# Patient Record
Sex: Female | Born: 1965 | Race: White | Hispanic: No
Health system: Southern US, Community
[De-identification: ages and names within clinical notes are randomized; demographics above are authoritative.]

## PROBLEM LIST (undated history)

## (undated) DIAGNOSIS — F32A Depression, unspecified: Secondary | ICD-10-CM

## (undated) DIAGNOSIS — F329 Major depressive disorder, single episode, unspecified: Secondary | ICD-10-CM

---
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
## 2016-03-10 ENCOUNTER — Other Ambulatory Visit: Payer: Self-pay | Admitting: Family Medicine

## 2016-03-10 DIAGNOSIS — Z1231 Encounter for screening mammogram for malignant neoplasm of breast: Secondary | ICD-10-CM

## 2016-03-20 ENCOUNTER — Ambulatory Visit
Admission: RE | Admit: 2016-03-20 | Discharge: 2016-03-20 | Disposition: A | Discharge status: Home / Self Care | Payer: PRIVATE HEALTH INSURANCE | Source: Ambulatory Visit | Attending: Family Medicine | Admitting: Family Medicine

## 2016-03-20 DIAGNOSIS — Z1231 Encounter for screening mammogram for malignant neoplasm of breast: Secondary | ICD-10-CM

## 2019-01-28 ENCOUNTER — Other Ambulatory Visit: Payer: PRIVATE HEALTH INSURANCE

## 2019-01-28 ENCOUNTER — Other Ambulatory Visit: Payer: Self-pay

## 2019-01-28 DIAGNOSIS — Z20822 Contact with and (suspected) exposure to covid-19: Secondary | ICD-10-CM

## 2019-01-30 LAB — NOVEL CORONAVIRUS, NAA: SARS-CoV-2, NAA: NOT DETECTED

## 2019-02-01 ENCOUNTER — Telehealth: Payer: Self-pay | Admitting: Emergency Medicine

## 2019-02-01 NOTE — Telephone Encounter (Signed)
Patient returned [name]'s call about COVID results, verified identity with two identifiers and then provided negative result via phone.  Patient verbalized understanding

## 2019-03-27 ENCOUNTER — Other Ambulatory Visit: Payer: Self-pay

## 2019-03-27 DIAGNOSIS — Z20822 Contact with and (suspected) exposure to covid-19: Secondary | ICD-10-CM

## 2019-03-28 LAB — NOVEL CORONAVIRUS, NAA: SARS-CoV-2, NAA: NOT DETECTED

## 2019-07-16 ENCOUNTER — Other Ambulatory Visit: Payer: Self-pay | Admitting: Physician Assistant

## 2019-07-16 DIAGNOSIS — Z1231 Encounter for screening mammogram for malignant neoplasm of breast: Secondary | ICD-10-CM

## 2019-08-25 ENCOUNTER — Ambulatory Visit
Admission: RE | Admit: 2019-08-25 | Discharge: 2019-08-25 | Disposition: A | Discharge status: Home / Self Care | Payer: PRIVATE HEALTH INSURANCE | Source: Ambulatory Visit | Attending: Physician Assistant | Admitting: Physician Assistant

## 2019-08-25 ENCOUNTER — Other Ambulatory Visit: Payer: Self-pay

## 2019-08-25 DIAGNOSIS — Z1231 Encounter for screening mammogram for malignant neoplasm of breast: Secondary | ICD-10-CM

## 2019-12-01 ENCOUNTER — Encounter: Payer: Self-pay | Admitting: Emergency Medicine

## 2019-12-01 ENCOUNTER — Ambulatory Visit
Admission: EM | Admit: 2019-12-01 | Discharge: 2019-12-01 | Disposition: A | Discharge status: Home / Self Care | Payer: PRIVATE HEALTH INSURANCE

## 2019-12-01 ENCOUNTER — Other Ambulatory Visit: Payer: Self-pay

## 2019-12-01 DIAGNOSIS — R059 Cough, unspecified: Secondary | ICD-10-CM

## 2019-12-01 DIAGNOSIS — J0100 Acute maxillary sinusitis, unspecified: Secondary | ICD-10-CM | POA: Diagnosis not present

## 2019-12-01 HISTORY — DX: Depression, unspecified: F32.A

## 2019-12-01 MED ORDER — BENZONATATE 100 MG PO CAPS
100.0000 mg | ORAL_CAPSULE | Freq: Three times a day (TID) | ORAL | 0 refills | Status: AC
Start: 2019-12-01 — End: ?

## 2019-12-01 MED ORDER — AMOXICILLIN-POT CLAVULANATE 875-125 MG PO TABS: 1.0000 | ORAL_TABLET | Freq: Two times a day (BID) | ORAL | 0 refills | Status: AC

## 2019-12-01 NOTE — ED Triage Notes (Signed)
Sinus congestion, hacking cough and ears feels stopped up  x 12 days.  Has taken nyquil, delsym and claritin d with no relief.

## 2019-12-01 NOTE — Discharge Instructions (Signed)
Get plenty of rest and push fluids Tessalon Perles prescribed for cough Augmentin prescribed.  Take as directed and to completion Use OTC medications like ibuprofen or tylenol as needed fever or pain Call or go to the ED if you have any new or worsening symptoms such as fever, worsening cough, shortness of breath, chest tightness, chest pain, turning blue, changes in mental status, etc..Marland Kitchen

## 2019-12-01 NOTE — ED Provider Notes (Signed)
[facility]   [id number] 12/01/19 Arrival Time: 1532   CC: URI symptoms  SUBJECTIVE: History from: patient.  Ashley Vargas is a 54 y.o. female who presents with sinus congestion, sinus pain/ pressure, hacking cough and ear pressure x 12 days.  Denies sick exposure to COVID, flu or strep.  Has tried nyquil, delsym, and claritin d with relief.  Denies aggravating factors.  Reports previous symptoms in the past with sinus infection.   Denies fever, chills, SOB, wheezing, chest pain, nausea, changes in bowel or bladder habits.    ROS: As per HPI.  All other pertinent ROS negative.     Past Medical History:  Diagnosis Date  . Depression    History reviewed. No pertinent surgical history. Allergies  Allergen Reactions  . Shellfish Allergy Anaphylaxis   No current facility-administered medications on file prior to encounter.   Current Outpatient Medications on File Prior to Encounter  Medication Sig Dispense Refill  . FLUoxetine (PROZAC) 10 MG capsule Take 10 mg by mouth daily.     Social History   Socioeconomic History  . Marital status: Unknown    Spouse name: Not on file  . Number of children: Not on file  . Years of education: Not on file  . Highest education level: Not on file  Occupational History  . Not on file  Tobacco Use  . Smoking status: Never Smoker  . Smokeless tobacco: Never Used  Substance and Sexual Activity  . Alcohol use: Yes    Comment: 3- 5 days a week   . Drug use: Never  . Sexual activity: Not on file  Other Topics Concern  . Not on file  Social History Narrative  . Not on file   Social Determinants of Health   Financial Resource Strain:   . Difficulty of Paying Living Expenses:   Food Insecurity:   . Worried About Programme researcher, broadcasting/film/video in the Last Year:   . Barista in the Last Year:   Transportation Needs:   . Freight forwarder (Medical):   Marland Kitchen Lack of Transportation (Non-Medical):   Physical Activity:   .  Days of Exercise per Week:   . Minutes of Exercise per Session:   Stress:   . Feeling of Stress :   Social Connections:   . Frequency of Communication with Friends and Family:   . Frequency of Social Gatherings with Friends and Family:   . Attends Religious Services:   . Active Member of Clubs or Organizations:   . Attends Banker Meetings:   Marland Kitchen Marital Status:   Intimate Partner Violence:   . Fear of Current or Ex-Partner:   . Emotionally Abused:   Marland Kitchen Physically Abused:   . Sexually Abused:    No family history on file.  OBJECTIVE:  Vitals:   12/01/19 1544 12/01/19 1548  BP:  132/89  Pulse:  89  Resp:  18  Temp:  98.7 F (37.1 C)  TempSrc:  Oral  SpO2:  95%  Weight: 189 lb (85.7 kg)   Height: 5\' 7"  (1.702 m)     General appearance: alert; appears fatigued, but nontoxic; speaking in full sentences and tolerating own secretions HEENT: NCAT; Ears: EACs clear, TMs pearly gray; Eyes: PERRL.  EOM grossly intact. Sinuses: TTP over frontal and maxillary sinuses; Nose: nares patent without rhinorrhea, nares erythematous; Throat: oropharynx clear, tonsils non erythematous or enlarged, uvula midline  Neck: supple without LAD Lungs: unlabored respirations, symmetrical air entry;  cough: moderate; no respiratory distress; CTAB Heart: regular rate and rhythm.   Skin: warm and dry Psychological: alert and cooperative; normal mood and affect  ASSESSMENT & PLAN:  1. Acute non-recurrent maxillary sinusitis   2. Cough     Meds ordered this encounter  Medications  . amoxicillin-clavulanate (AUGMENTIN) 875-125 MG tablet    Sig: Take 1 tablet by mouth every 12 (twelve) hours for 10 days.    Dispense:  20 tablet    Refill:  0    Order Specific Question:   Supervising Provider    Answer:   Raylene Everts [[id number]]  . benzonatate (TESSALON) 100 MG capsule    Sig: Take 1 capsule (100 mg total) by mouth every 8 (eight) hours.    Dispense:  21 capsule    Refill:  0     Order Specific Question:   Supervising Provider    Answer:   Raylene Everts [[id number]]   Get plenty of rest and push fluids Tessalon Perles prescribed for cough Augmentin prescribed.  Take as directed and to completion Use OTC medications like ibuprofen or tylenol as needed fever or pain Call or go to the ED if you have any new or worsening symptoms such as fever, worsening cough, shortness of breath, chest tightness, chest pain, turning blue, changes in mental status, etc...   Reviewed expectations re: course of current medical issues. Questions answered. Outlined signs and symptoms indicating need for more acute intervention. Patient verbalized understanding. After Visit Summary given.         Lestine Box, PA-C 12/01/19 1607

## 2020-09-25 ENCOUNTER — Emergency Department (HOSPITAL_COMMUNITY): Payer: 59

## 2020-09-25 ENCOUNTER — Other Ambulatory Visit: Payer: Self-pay

## 2020-09-25 ENCOUNTER — Emergency Department (HOSPITAL_COMMUNITY)
Admission: EM | Admit: 2020-09-25 | Discharge: 2020-09-25 | Disposition: A | Discharge status: Home / Self Care | Payer: 59 | Attending: Emergency Medicine | Admitting: Emergency Medicine

## 2020-09-25 DIAGNOSIS — F10929 Alcohol use, unspecified with intoxication, unspecified: Secondary | ICD-10-CM

## 2020-09-25 DIAGNOSIS — S0990XA Unspecified injury of head, initial encounter: Secondary | ICD-10-CM

## 2020-09-25 DIAGNOSIS — R935 Abnormal findings on diagnostic imaging of other abdominal regions, including retroperitoneum: Secondary | ICD-10-CM

## 2020-09-25 DIAGNOSIS — T07XXXA Unspecified multiple injuries, initial encounter: Secondary | ICD-10-CM

## 2020-09-25 DIAGNOSIS — Y92410 Unspecified street and highway as the place of occurrence of the external cause: Secondary | ICD-10-CM | POA: Diagnosis not present

## 2020-09-25 DIAGNOSIS — R9389 Abnormal findings on diagnostic imaging of other specified body structures: Secondary | ICD-10-CM | POA: Diagnosis not present

## 2020-09-25 DIAGNOSIS — R000 Tachycardia, unspecified: Secondary | ICD-10-CM | POA: Insufficient documentation

## 2020-09-25 DIAGNOSIS — F10129 Alcohol abuse with intoxication, unspecified: Secondary | ICD-10-CM | POA: Diagnosis not present

## 2020-09-25 DIAGNOSIS — Z23 Encounter for immunization: Secondary | ICD-10-CM | POA: Insufficient documentation

## 2020-09-25 DIAGNOSIS — M79632 Pain in left forearm: Secondary | ICD-10-CM | POA: Insufficient documentation

## 2020-09-25 DIAGNOSIS — Y907 Blood alcohol level of 200-239 mg/100 ml: Secondary | ICD-10-CM | POA: Insufficient documentation

## 2020-09-25 DIAGNOSIS — M79631 Pain in right forearm: Secondary | ICD-10-CM | POA: Insufficient documentation

## 2020-09-25 DIAGNOSIS — S301XXA Contusion of abdominal wall, initial encounter: Secondary | ICD-10-CM | POA: Insufficient documentation

## 2020-09-25 DIAGNOSIS — S0083XA Contusion of other part of head, initial encounter: Secondary | ICD-10-CM | POA: Insufficient documentation

## 2020-09-25 LAB — PROTIME-INR
INR: 0.9 (ref 0.8–1.2)
Prothrombin Time: 12.2 seconds (ref 11.4–15.2)

## 2020-09-25 LAB — COMPREHENSIVE METABOLIC PANEL
ALT: 31 U/L (ref 0–44)
AST: 26 U/L (ref 15–41)
Albumin: 4 g/dL (ref 3.5–5.0)
Alkaline Phosphatase: 58 U/L (ref 38–126)
Anion gap: 9 (ref 5–15)
BUN: 8 mg/dL (ref 6–20)
CO2: 24 mmol/L (ref 22–32)
Calcium: 9.2 mg/dL (ref 8.9–10.3)
Chloride: 104 mmol/L (ref 98–111)
Creatinine, Ser: 0.83 mg/dL (ref 0.44–1.00)
GFR, Estimated: >60 mL/min (ref >60)
Glucose, Bld: 111 mg/dL — ABNORMAL HIGH (ref 70–99)
Potassium: 3.4 mmol/L — ABNORMAL LOW (ref 3.5–5.1)
Sodium: 137 mmol/L (ref 135–145)
Total Bilirubin: 0.8 mg/dL (ref 0.3–1.2)
Total Protein: 6.8 g/dL (ref 6.5–8.1)

## 2020-09-25 LAB — TYPE AND SCREEN
ABO/RH(D): O POS
Antibody Screen: NEGATIVE

## 2020-09-25 LAB — CBC WITH DIFFERENTIAL/PLATELET
Abs Immature Granulocytes: 0.03 10*3/uL (ref 0.00–0.07)
Basophils Absolute: 0.1 10*3/uL (ref 0.0–0.1)
Basophils Relative: 1 %
Eosinophils Absolute: 0.3 10*3/uL (ref 0.0–0.5)
Eosinophils Relative: 3 %
HCT: 37.7 % (ref 36.0–46.0)
Hemoglobin: 12.9 g/dL (ref 12.0–15.0)
Immature Granulocytes: 0 %
Lymphocytes Relative: 41 %
Lymphs Abs: 3.1 10*3/uL (ref 0.7–4.0)
MCH: 33.9 pg (ref 26.0–34.0)
MCHC: 34.2 g/dL (ref 30.0–36.0)
MCV: 99.2 fL (ref 80.0–100.0)
Monocytes Absolute: 0.3 10*3/uL (ref 0.1–1.0)
Monocytes Relative: 4 %
Neutro Abs: 3.8 10*3/uL (ref 1.7–7.7)
Neutrophils Relative %: 51 %
Platelets: 220 10*3/uL (ref 150–400)
RBC: 3.8 MIL/uL — ABNORMAL LOW (ref 3.87–5.11)
RDW: 12.4 % (ref 11.5–15.5)
WBC: 7.6 10*3/uL (ref 4.0–10.5)
nRBC: 0 % (ref 0.0–0.2)

## 2020-09-25 LAB — ETHANOL: Alcohol, Ethyl (B): 237 mg/dL — ABNORMAL HIGH (ref <10)

## 2020-09-25 MED ORDER — IOHEXOL 300 MG/ML  SOLN
100.0000 mL | Freq: Once | INTRAMUSCULAR | Status: AC | PRN
  Administered 2020-09-25: 100 mL via INTRAVENOUS

## 2020-09-25 MED ORDER — TETANUS-DIPHTH-ACELL PERTUSSIS 5-2.5-18.5 LF-MCG/0.5 IM SUSY
0.5000 mL | PREFILLED_SYRINGE | Freq: Once | INTRAMUSCULAR | Status: AC
  Administered 2020-09-25: 0.5 mL via INTRAMUSCULAR
  Filled 2020-09-25: qty 0.5
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
## 2020-09-25 NOTE — ED Notes (Addendum)
Home with husband

## 2020-09-25 NOTE — ED Notes (Signed)
Patient transported to CT 

## 2020-09-25 NOTE — ED Notes (Signed)
The pt insisted on walking to the br  Steady gait
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
## 2020-09-25 NOTE — ED Notes (Signed)
Patient removed c-collar, stated it was "irritating more than anything".
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
